# Patient Record
Sex: Male | Born: 1995 | Race: Black or African American | Hispanic: No | Marital: Single | Smoking: Never smoker
Health system: Southern US, Community
[De-identification: ages and names within clinical notes are randomized; demographics above are authoritative.]

## PROBLEM LIST (undated history)

## (undated) DIAGNOSIS — J302 Other seasonal allergic rhinitis: Secondary | ICD-10-CM

---
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
## 2015-04-15 ENCOUNTER — Encounter (HOSPITAL_COMMUNITY): Payer: Self-pay | Admitting: Emergency Medicine

## 2015-04-15 ENCOUNTER — Emergency Department (HOSPITAL_COMMUNITY)
Admission: EM | Admit: 2015-04-15 | Discharge: 2015-04-15 | Disposition: A | Discharge status: Home / Self Care | Source: Home / Self Care | Attending: Family Medicine | Admitting: Family Medicine

## 2015-04-15 DIAGNOSIS — T148 Other injury of unspecified body region: Secondary | ICD-10-CM | POA: Diagnosis not present

## 2015-04-15 DIAGNOSIS — T148XXA Other injury of unspecified body region, initial encounter: Secondary | ICD-10-CM

## 2015-04-15 MED ORDER — BACITRACIN 500 UNIT/GM EX OINT
1.0000 "application " | TOPICAL_OINTMENT | Freq: Once | CUTANEOUS | Status: AC
  Administered 2015-04-15: 1 via TOPICAL
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
## 2015-04-15 NOTE — ED Notes (Signed)
Pt has blisters to bilateral great toes he states he was playing basketball with flat tennis shoes on

## 2015-04-15 NOTE — Discharge Instructions (Signed)
    Blisters Blisters are fluid-filled sacs that form within the skin. Common causes of blistering are friction, burns, and exposure to irritating chemicals. The fluid in the blister protects the underlying damaged skin. Most of the time it is not recommended that you open blisters. When a blister is opened, there is an increased chance for infection. Usually, a blister will open on its own. They then dry up and peel off within 10 days. If the blister is tense and uncomfortable (painful) the fluid may be drained. If it is drained the roof of the blister should be left intact. The draining should only be done by a medical professional under aseptic conditions. Poorly fitting shoes and boots can cause blisters by being too tight or too loose. Wearing extra socks or using tape, bandages, or pads over the blister-prone area helps prevent the problem by reducing friction. Blisters heal more slowly if you have diabetes or if you have problems with your circulation. You need to be careful about medical follow-up to prevent infection. HOME CARE INSTRUCTIONS  Protect areas where blisters have formed until the skin is healed. Use a special bandage with a hole cut in the middle around the blister. This reduces pressure and friction. When the blister breaks, trim off the loose skin and keep the area clean by washing it with soap daily. Soaking the blister or broken-open blister with diluted vinegar twice daily for 15 minutes will dry it up and speed the healing. Use 3 tablespoons of white vinegar per quart of water (45 mL white vinegar per liter of water). An antibiotic ointment and a bandage can be used to cover the area after soaking.  SEEK MEDICAL CARE IF:   You develop increased redness, pain, swelling, or drainage in the blistered area.  You develop a pus-like discharge from the blistered area, chills, or a fever. MAKE SURE YOU:   Understand these instructions.  Will watch your condition.  Will get help  right away if you are not doing well or get worse. Document Released: 11/14/2004 Document Revised: 12/30/2011 Document Reviewed: 10/12/2008 Exit[employer] Patient Information 2015 [employer]. This information is not intended to replace advice given to you by your health care provider. Make sure you discuss any questions you have with your health care provider.  

## 2020-11-07 ENCOUNTER — Other Ambulatory Visit
